# Patient Record
Sex: Male | Born: 1995 | Hispanic: Yes | Marital: Single | Smoking: Never smoker
Health system: Southern US, Community
[De-identification: ages and names within clinical notes are randomized; demographics above are authoritative.]

---

## 2018-11-26 ENCOUNTER — Encounter (HOSPITAL_COMMUNITY): Payer: Self-pay | Admitting: Emergency Medicine

## 2018-11-26 ENCOUNTER — Other Ambulatory Visit: Payer: Self-pay

## 2018-11-26 ENCOUNTER — Emergency Department (HOSPITAL_COMMUNITY): Payer: No Typology Code available for payment source

## 2018-11-26 ENCOUNTER — Emergency Department (HOSPITAL_COMMUNITY)
Admission: EM | Admit: 2018-11-26 | Discharge: 2018-11-27 | Disposition: A | Discharge status: Home / Self Care | Payer: No Typology Code available for payment source | Attending: Emergency Medicine | Admitting: Emergency Medicine

## 2018-11-26 DIAGNOSIS — Y92410 Unspecified street and highway as the place of occurrence of the external cause: Secondary | ICD-10-CM | POA: Insufficient documentation

## 2018-11-26 DIAGNOSIS — Z23 Encounter for immunization: Secondary | ICD-10-CM | POA: Diagnosis not present

## 2018-11-26 DIAGNOSIS — M542 Cervicalgia: Secondary | ICD-10-CM | POA: Diagnosis not present

## 2018-11-26 DIAGNOSIS — R55 Syncope and collapse: Secondary | ICD-10-CM | POA: Insufficient documentation

## 2018-11-26 DIAGNOSIS — M25512 Pain in left shoulder: Secondary | ICD-10-CM | POA: Diagnosis not present

## 2018-11-26 DIAGNOSIS — S61511A Laceration without foreign body of right wrist, initial encounter: Secondary | ICD-10-CM | POA: Diagnosis not present

## 2018-11-26 DIAGNOSIS — R51 Headache: Secondary | ICD-10-CM | POA: Diagnosis not present

## 2018-11-26 DIAGNOSIS — S6991XA Unspecified injury of right wrist, hand and finger(s), initial encounter: Secondary | ICD-10-CM | POA: Diagnosis present

## 2018-11-26 DIAGNOSIS — Y999 Unspecified external cause status: Secondary | ICD-10-CM | POA: Insufficient documentation

## 2018-11-26 DIAGNOSIS — Y9389 Activity, other specified: Secondary | ICD-10-CM | POA: Insufficient documentation

## 2018-11-26 DIAGNOSIS — M7918 Myalgia, other site: Secondary | ICD-10-CM

## 2018-11-26 LAB — COMPREHENSIVE METABOLIC PANEL
ALT: 22 U/L (ref 0–44)
AST: 25 U/L (ref 15–41)
Albumin: 4.8 g/dL (ref 3.5–5.0)
Alkaline Phosphatase: 92 U/L (ref 38–126)
Anion gap: 12 (ref 5–15)
BUN: 20 mg/dL (ref 6–20)
CO2: 22 mmol/L (ref 22–32)
Calcium: 9.4 mg/dL (ref 8.9–10.3)
Chloride: 104 mmol/L (ref 98–111)
Creatinine, Ser: 1.21 mg/dL (ref 0.61–1.24)
GFR calc Af Amer: >60 mL/min (ref >60)
GFR calc non Af Amer: >60 mL/min (ref >60)
Glucose, Bld: 107 mg/dL — ABNORMAL HIGH (ref 70–99)
Potassium: 3.6 mmol/L (ref 3.5–5.1)
Sodium: 138 mmol/L (ref 135–145)
Total Bilirubin: 1.8 mg/dL — ABNORMAL HIGH (ref 0.3–1.2)
Total Protein: 7 g/dL (ref 6.5–8.1)

## 2018-11-26 LAB — CBC WITH DIFFERENTIAL/PLATELET
Abs Immature Granulocytes: 0.11 10*3/uL — ABNORMAL HIGH (ref 0.00–0.07)
Basophils Absolute: 0 10*3/uL (ref 0.0–0.1)
Basophils Relative: 0 %
Eosinophils Absolute: 0.1 10*3/uL (ref 0.0–0.5)
Eosinophils Relative: 1 %
HCT: 45.4 % (ref 39.0–52.0)
Hemoglobin: 15.6 g/dL (ref 13.0–17.0)
Immature Granulocytes: 1 %
Lymphocytes Relative: 14 %
Lymphs Abs: 2.1 10*3/uL (ref 0.7–4.0)
MCH: 30.7 pg (ref 26.0–34.0)
MCHC: 34.4 g/dL (ref 30.0–36.0)
MCV: 89.4 fL (ref 80.0–100.0)
Monocytes Absolute: 1 10*3/uL (ref 0.1–1.0)
Monocytes Relative: 7 %
Neutro Abs: 11.1 10*3/uL — ABNORMAL HIGH (ref 1.7–7.7)
Neutrophils Relative %: 77 %
Platelets: 300 10*3/uL (ref 150–400)
RBC: 5.08 MIL/uL (ref 4.22–5.81)
RDW: 12.2 % (ref 11.5–15.5)
WBC: 14.4 10*3/uL — ABNORMAL HIGH (ref 4.0–10.5)
nRBC: 0 % (ref 0.0–0.2)

## 2018-11-26 LAB — LIPASE, BLOOD: Lipase: 31 U/L (ref 11–51)

## 2018-11-26 MED ORDER — SODIUM CHLORIDE 0.9 % IV BOLUS
500.0000 mL | Freq: Once | INTRAVENOUS | Status: AC
  Administered 2018-11-26: 500 mL via INTRAVENOUS

## 2018-11-26 MED ORDER — TETANUS-DIPHTH-ACELL PERTUSSIS 5-2.5-18.5 LF-MCG/0.5 IM SUSP
0.5000 mL | Freq: Once | INTRAMUSCULAR | Status: AC
  Administered 2018-11-26: 0.5 mL via INTRAMUSCULAR
  Filled 2018-11-26: qty 0.5

## 2018-11-26 MED ORDER — METHOCARBAMOL 500 MG PO TABS: 500.0000 mg | ORAL_TABLET | Freq: Every evening | ORAL | 0 refills | Status: AC | PRN

## 2018-11-26 MED ORDER — MORPHINE SULFATE (PF) 4 MG/ML IV SOLN
4.0000 mg | Freq: Once | INTRAVENOUS | Status: AC
  Administered 2018-11-26: 4 mg via INTRAVENOUS
  Filled 2018-11-26: qty 1

## 2018-11-26 MED ORDER — CEPHALEXIN 500 MG PO CAPS: 500.0000 mg | ORAL_CAPSULE | Freq: Four times a day (QID) | ORAL | 0 refills | Status: AC

## 2018-11-26 MED ORDER — IOHEXOL 300 MG/ML  SOLN
100.0000 mL | Freq: Once | INTRAMUSCULAR | Status: AC | PRN
  Administered 2018-11-26: 21:00:00 100 mL via INTRAVENOUS
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
## 2018-11-26 NOTE — ED Notes (Signed)
Patient transported to CT 

## 2018-11-26 NOTE — Discharge Instructions (Addendum)
You were given a prescription for antibiotics. Please take the antibiotic prescription fully.   You may alternate taking Tylenol and Ibuprofen as needed for pain control. You may take 400-600 mg of ibuprofen every 6 hours and [phone] mg of Tylenol every 6 hours. Do not exceed 4000 mg of Tylenol daily as this can lead to liver damage. Also, make sure to take Ibuprofen with meals as it can cause an upset stomach. Do not take other NSAIDs while taking Ibuprofen such as (Aleve, Naprosyn, Aspirin, Celebrex, etc) and do not take more than the prescribed dose as this can lead to ulcers and bleeding in your GI tract. You may use warm and cold compresses to help with your symptoms.  You were given a prescription for Robaxin which is a muscle relaxer.  You should not drive, work, or operate machinery while taking this medication as it can make you very drowsy.     Please follow up with your primary doctor within the next 7-10 days for re-evaluation and further treatment of your symptoms.   Please return to the ER sooner if you have any new or worsening symptoms.
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
## 2018-11-27 NOTE — ED Notes (Signed)
Discharge instructions discussed with pt. Pt verbalized understanding and no questions at this time. Work note given to pt.
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
